# Patient Record
Sex: Male | Born: 1985 | Race: Black or African American | Hispanic: No | Marital: Single | Smoking: Current every day smoker
Health system: Southern US, Community
[De-identification: ages and names within clinical notes are randomized; demographics above are authoritative.]

## PROBLEM LIST (undated history)

## (undated) DIAGNOSIS — F419 Anxiety disorder, unspecified: Secondary | ICD-10-CM

## (undated) DIAGNOSIS — I10 Essential (primary) hypertension: Secondary | ICD-10-CM

## (undated) DIAGNOSIS — B20 Human immunodeficiency virus [HIV] disease: Secondary | ICD-10-CM

---

## 2014-01-22 ENCOUNTER — Ambulatory Visit: Payer: Self-pay | Admitting: Family Medicine

## 2014-01-22 LAB — GC/CHLAMYDIA PROBE AMP

## 2014-02-18 ENCOUNTER — Emergency Department: Payer: Self-pay | Admitting: Emergency Medicine

## 2014-02-18 ENCOUNTER — Ambulatory Visit: Payer: Self-pay | Admitting: Family Medicine

## 2014-02-19 LAB — URINALYSIS, COMPLETE
Bacteria: NONE SEEN
Bilirubin,UR: NEGATIVE
Blood: NEGATIVE
GLUCOSE, UR: NEGATIVE mg/dL (ref 0–75)
Ketone: NEGATIVE
LEUKOCYTE ESTERASE: NEGATIVE
Nitrite: NEGATIVE
PH: 6 (ref 4.5–8.0)
Protein: NEGATIVE
RBC,UR: 2 /HPF (ref 0–5)
SPECIFIC GRAVITY: 1.02 (ref 1.003–1.030)

## 2014-02-19 LAB — CBC
HCT: 46.3 % (ref 40.0–52.0)
HGB: 15.7 g/dL (ref 13.0–18.0)
MCH: 29.2 pg (ref 26.0–34.0)
MCHC: 33.9 g/dL (ref 32.0–36.0)
MCV: 86 fL (ref 80–100)
Platelet: 153 10*3/uL (ref 150–440)
RBC: 5.39 10*6/uL (ref 4.40–5.90)
RDW: 14.2 % (ref 11.5–14.5)
WBC: 8.6 10*3/uL (ref 3.8–10.6)

## 2014-02-19 LAB — BASIC METABOLIC PANEL
Anion Gap: 4 — ABNORMAL LOW (ref 7–16)
BUN: 6 mg/dL — ABNORMAL LOW (ref 7–18)
CALCIUM: 8.7 mg/dL (ref 8.5–10.1)
CREATININE: 0.87 mg/dL (ref 0.60–1.30)
Chloride: 107 mmol/L (ref 98–107)
Co2: 27 mmol/L (ref 21–32)
EGFR (African American): >60
Glucose: 79 mg/dL (ref 65–99)
OSMOLALITY: 272 (ref 275–301)
Potassium: 3.5 mmol/L (ref 3.5–5.1)
Sodium: 138 mmol/L (ref 136–145)

## 2014-02-21 ENCOUNTER — Observation Stay: Payer: Self-pay | Admitting: Surgery

## 2014-02-21 LAB — CBC WITH DIFFERENTIAL/PLATELET
BASOS ABS: 0 10*3/uL (ref 0.0–0.1)
Basophil %: 0.4 %
Eosinophil #: 0 10*3/uL (ref 0.0–0.7)
Eosinophil %: 0.2 %
HCT: 46 % (ref 40.0–52.0)
HGB: 15.3 g/dL (ref 13.0–18.0)
LYMPHS ABS: 2 10*3/uL (ref 1.0–3.6)
Lymphocyte %: 19.2 %
MCH: 28.1 pg (ref 26.0–34.0)
MCHC: 33.3 g/dL (ref 32.0–36.0)
MCV: 84 fL (ref 80–100)
Monocyte #: 1.2 x10 3/mm — ABNORMAL HIGH (ref 0.2–1.0)
Monocyte %: 11.3 %
NEUTROS ABS: 7.1 10*3/uL — AB (ref 1.4–6.5)
NEUTROS PCT: 68.9 %
PLATELETS: 168 10*3/uL (ref 150–440)
RBC: 5.46 10*6/uL (ref 4.40–5.90)
RDW: 13.6 % (ref 11.5–14.5)
WBC: 10.4 10*3/uL (ref 3.8–10.6)

## 2014-02-21 LAB — COMPREHENSIVE METABOLIC PANEL
ALBUMIN: 3.1 g/dL — AB (ref 3.4–5.0)
ANION GAP: 4 — AB (ref 7–16)
Alkaline Phosphatase: 79 U/L
BILIRUBIN TOTAL: 1.2 mg/dL — AB (ref 0.2–1.0)
BUN: 8 mg/dL (ref 7–18)
CREATININE: 0.83 mg/dL (ref 0.60–1.30)
Calcium, Total: 8.4 mg/dL — ABNORMAL LOW (ref 8.5–10.1)
Chloride: 105 mmol/L (ref 98–107)
Co2: 27 mmol/L (ref 21–32)
EGFR (African American): >60
EGFR (Non-African Amer.): >60
GLUCOSE: 100 mg/dL — AB (ref 65–99)
Osmolality: 270 (ref 275–301)
Potassium: 3.5 mmol/L (ref 3.5–5.1)
SGOT(AST): 22 U/L (ref 15–37)
SGPT (ALT): 19 U/L (ref 12–78)
SODIUM: 136 mmol/L (ref 136–145)
TOTAL PROTEIN: 8.6 g/dL — AB (ref 6.4–8.2)

## 2014-04-12 ENCOUNTER — Ambulatory Visit: Payer: Self-pay | Admitting: Family Medicine

## 2014-04-12 LAB — GC/CHLAMYDIA PROBE AMP

## 2014-08-03 ENCOUNTER — Encounter (HOSPITAL_COMMUNITY): Payer: Self-pay | Admitting: Emergency Medicine

## 2014-08-03 ENCOUNTER — Emergency Department (HOSPITAL_COMMUNITY)
Admission: EM | Admit: 2014-08-03 | Discharge: 2014-08-03 | Disposition: A | Discharge status: Home / Self Care | Payer: Self-pay | Attending: Emergency Medicine | Admitting: Emergency Medicine

## 2014-08-03 DIAGNOSIS — IMO0002 Reserved for concepts with insufficient information to code with codable children: Secondary | ICD-10-CM | POA: Insufficient documentation

## 2014-08-03 DIAGNOSIS — S0083XA Contusion of other part of head, initial encounter: Principal | ICD-10-CM | POA: Insufficient documentation

## 2014-08-03 DIAGNOSIS — S199XXA Unspecified injury of neck, initial encounter: Secondary | ICD-10-CM

## 2014-08-03 DIAGNOSIS — T07XXXA Unspecified multiple injuries, initial encounter: Secondary | ICD-10-CM

## 2014-08-03 DIAGNOSIS — S0003XA Contusion of scalp, initial encounter: Secondary | ICD-10-CM | POA: Insufficient documentation

## 2014-08-03 DIAGNOSIS — S0993XA Unspecified injury of face, initial encounter: Secondary | ICD-10-CM | POA: Insufficient documentation

## 2014-08-03 DIAGNOSIS — Z21 Asymptomatic human immunodeficiency virus [HIV] infection status: Secondary | ICD-10-CM | POA: Insufficient documentation

## 2014-08-03 DIAGNOSIS — F17200 Nicotine dependence, unspecified, uncomplicated: Secondary | ICD-10-CM | POA: Insufficient documentation

## 2014-08-03 DIAGNOSIS — S1093XA Contusion of unspecified part of neck, initial encounter: Secondary | ICD-10-CM

## 2014-08-03 HISTORY — DX: Human immunodeficiency virus (HIV) disease: B20

## 2014-08-03 MED ORDER — IBUPROFEN 800 MG PO TABS: 800.0000 mg | ORAL_TABLET | Freq: Three times a day (TID) | ORAL | Status: DC

## 2014-08-03 MED ORDER — IBUPROFEN 800 MG PO TABS
800.0000 mg | ORAL_TABLET | Freq: Once | ORAL | Status: AC
  Administered 2014-08-03: 800 mg via ORAL
  Filled 2014-08-03: qty 1

## 2014-08-03 NOTE — ED Notes (Signed)
Pt waiting to speak with social worker per request. Social worker aware.
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
## 2014-08-03 NOTE — Progress Notes (Signed)
CSW met with pt to discuss transportation options. Pt requested taxi voucher. CSW informed pt he did not meet criteria for voucher. Pt was in town for the evening with his cousin. He does not have a wallet and states he does not know anyone in [place]. States he lives in [place]. Pt states his cousin lives in [place]. Pt attempted to call cousin multiple times with CSW in room but unable to reach. CSW problem solved with pt and came up with plan for bus pass and train ticket to [place]. CSW provided bus pass, ticket, and directions. Pt thanked CSW for these resources.  Rochele Pages,     ED CSW  phone: [phone] 10:44am
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
## 2014-08-03 NOTE — ED Notes (Signed)
Pt to be transported via bus to train station.  SW preparing train ticket at present time.  Reports will be done within the hour.  Pt aware,  Pt given [place] sandwich and ginger ale.

## 2014-08-03 NOTE — ED Notes (Signed)
Pt refuses to sign consent to treat, refuses to speak with registration or any nursing staff. Crying in the room. VSS. Will let pt calm down some and attempt to triage again shortly.
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
## 2014-08-03 NOTE — ED Notes (Signed)
Social worker at bedside.

## 2014-08-03 NOTE — ED Notes (Addendum)
Per PA give pt phone to call for ride home. Pt given ice water.
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
## 2014-08-03 NOTE — Discharge Instructions (Signed)

## 2014-08-04 NOTE — ED Provider Notes (Signed)
Medical screening examination/treatment/procedure(s) were performed by non-physician practitioner and as supervising physician I was immediately available for consultation/collaboration.   EKG Interpretation None        Purvis Sheffield, MD 08/04/14 [phone]

## 2015-04-05 NOTE — H&P (Signed)
Subjective/Chief Complaint left groin pain and swelling.   History of Present Illness 29 y/o male with HIV presented monday to ER with pain and swelling in left groin.  Seen in ER and placed on antibiotics and was told to follow-up in our office.  Symptoms became worse and he returns.  Imaging on Monday showed a large abscess in left groin.   Past History HIV positive since 2000.   Past Med/Surgical Hx:  Sleep Apnea:   Depression:   OSA:   HIV:   ALLERGIES:  vancomycin: Anaphylaxis   Other Allergies none   HOME MEDICATIONS:  Medication Instructions Status  Cleocin HCl 300 mg oral capsule 1 cap(s) orally every 6 hours Active  ciprofloxacin 500 mg oral tablet 1 tab(s) orally every 12 hours Active  acetaminophen-oxyCODONE 300 mg-5 mg oral tablet 1 tab(s) orally every 6 hours as needed for pain Active  fluconazole 150 mg oral tablet 1 tab(s) orally once a week for skin rash Active  IBU 600 mg oral tablet 1 tab(s) orally up to 3 times a day as needed for pain Active  traMADol 50 mg oral tablet 1 tab(s) orally 1 to 3 times a day as needed for pain Active  Norvir  orally  Active  Truvada  orally  Active  Reyataz  orally  Active   Family and Social History:  Family History Non-Contributory   Social History positive  tobacco   + Tobacco Current (within 1 year)   Place of Living Home   Review of Systems:  Subjective/Chief Complaint see above.   Fever/Chills Yes   Medications/Allergies Reviewed Medications/Allergies reviewed   Physical Exam:  GEN no acute distress, disheveled, 100.1, p 100 bp 142/82   HEENT pale conjunctivae, PERRL   NECK supple   RESP normal resp effort   CARD regular rate   ABD no hernia  soft  large tender left groin mass, he was so tender and anxious he did not allow me to fully exam him.   LYMPH negative neck   EXTR negative cyanosis/clubbing   SKIN normal to palpation   NEURO cranial nerves intact   PSYCH A+O to time, place, person    Lab Results:  Hepatic:  12-Mar-15 18:01   Bilirubin, Total  1.2  Alkaline Phosphatase 79 (45-117 NOTE: New Reference Range 11/02/13)  SGPT (ALT) 19  SGOT (AST) 22  Total Protein, Serum  8.6  Albumin, Serum  3.1  Routine Chem:  12-Mar-15 18:01   Glucose, Serum  100  BUN 8  Creatinine (comp) 0.83  Sodium, Serum 136  Potassium, Serum 3.5  Chloride, Serum 105  CO2, Serum 27  Calcium (Total), Serum  8.4  Osmolality (calc) 270  eGFR (African American) >60  eGFR (Non-African American) >60 (eGFR values <37m/min/1.73 m2 may be an indication of chronic kidney disease (CKD). Calculated eGFR is useful in patients with stable renal function. The eGFR calculation will not be reliable in acutely ill patients when serum creatinine is changing rapidly. It is not useful in  patients on dialysis. The eGFR calculation may not be applicable to patients at the low and high extremes of body sizes, pregnant women, and vegetarians.)  Anion Gap  4  Routine Hem:  12-Mar-15 18:01   WBC (CBC) 10.4  RBC (CBC) 5.46  Hemoglobin (CBC) 15.3  Hematocrit (CBC) 46.0  Platelet Count (CBC) 168  MCV 84  MCH 28.1  MCHC 33.3  RDW 13.6  Neutrophil % 68.9  Lymphocyte % 19.2  Monocyte % 11.3  Eosinophil % 0.2  Basophil % 0.4  Neutrophil #  7.1  Lymphocyte # 2.0  Monocyte #  1.2  Eosinophil # 0.0  Basophil # 0.0 (Result(s) reported on 21 Feb 2014 at 07:24PM.)   Radiology Results: [place]:    10-Mar-15 02:26, US Testicle  US Testicle  REASON FOR EXAM:    left groin pain and swelling, pain to testicle and   soft tissue swelling in ingui  COMMENTS:       PROCEDURE: [place]  - US TESTICULAR W/DOPPLER  - Feb 19 2014  2:26AM     CLINICAL DATA:  Left groin pain and swelling, left testicle pain.    EXAM:  SCROTAL ULTRASOUND    DOPPLER ULTRASOUND OF THE TESTICLES    TECHNIQUE:  Complete ultrasound examination of the testicles, epididymis, and  other scrotal structures was performed. Color and spectral  Doppler  ultrasound were also utilizedto evaluate blood flow to the  testicles.    COMPARISON:  None.    FINDINGS:  Right testicle    Measurements: 4.2 x 2.3 x 2.8 cm. No mass or microlithiasis  visualized.    Left testicle    Measurements: 4.7 x 2.5 x 2.9 cm. No mass or microlithiasis  visualized.    Right epididymis:  Normal in size and appearance.    Left epididymis:  Normal in size and appearance.    Hydrocele:  None visualized.    Varicocele:  None visualized.    Pulsed Doppler interrogation of both testes demonstrates low  resistance arterial and venous waveforms bilaterally.    Within the left inguinal region, there is a lobulated fluid  collection with internal debris measuring 4.9 x 2.5 x 3.17. Thick  wall with associated vascularity. No visualized peristalsis.     IMPRESSION:  Normal sonographic appearance to the testicles.    Complex fluid in the region of the left inguinal canal. May reflect  an abscess. Incarcerated loop of bowel not excluded in the  appropriate clinical setting.    Discussed via telephone with Dr. Dahlia Client at 2:35 a.m. on 02/19/2014.      Electronically Signed    By: Carlos Levering M.D.    On: 02/19/2014 02:38     Verified By: Tommi Rumps, M.D.,  [place]:  PACS Image    10-Mar-15 05:08, CT Pelvis With Contrast  PACS Image  CT:  CT Pelvis With Contrast  REASON FOR EXAM:    swelling over left testicle concern for abscess vs   hernia eval;    NOTE: Nursing  COMMENTS:       PROCEDURE: CT  - CT PELVIS STANDARD W  - Feb 19 2014  5:08AM     CLINICAL DATA:  Left groin mass    EXAM:  CT PELVIS WITH CONTRAST    TECHNIQUE:  Multidetector CT imaging of the pelvis was performed using the  standard protocol following the bolus administration of intravenous  contrast.  CONTRAST:  100 cc Isovue 370    COMPARISON:  02/19/2014 ultrasound    FINDINGS:  Visualizedintraperitoneal contents within normal limits.  Normal  appendix. Thin walled bladder. Within the left perineum, just  lateral to the left inguinal canal, there is a 3.1 x 2.4 cm  peripherally enhancing fluid collection. No appreciable  communication with the inguinal canal or peritoneal contents.  Overlying subcutaneous fat stranding and skin thickening may be  reactive or reflect cellulitis. No subcutaneous gas. Prominent left  inguinal lymph nodes are likely reactive. Normal caliber iliac  vasculature. No acute osseous finding.   IMPRESSION:  3.1 x 2.4 cm fluid collection within the left perineum adjacent to  the left inguinal canal is most in keeping with an abscess.      Electronically Signed    By: Carlos Levering M.D.    On: 03/10/201506:02         Verified By: Tommi Rumps, M.D.,    Assessment/Admission Diagnosis 29 y/o male with HIV and large left groin abscess.   Plan admit, IV abx, zosyn and Flagyl.   plan I and D in am due to npo status and anesthesia availability.   Electronic Signatures: Sherri Rad (MD)  (Signed 12-Mar-15 20:17)  Authored: CHIEF COMPLAINT and HISTORY, PAST MEDICAL/SURGIAL HISTORY, ALLERGIES, Other Allergies, HOME MEDICATIONS, FAMILY AND SOCIAL HISTORY, REVIEW OF SYSTEMS, PHYSICAL EXAM, LABS, Radiology, ASSESSMENT AND PLAN   Last Updated: 12-Mar-15 20:17 by Sherri Rad (MD)

## 2015-04-05 NOTE — Op Note (Signed)
PATIENT NAME:  Larry [name], Tag L MR#:  [id number] DATE OF BIRTH:  1986/02/27  DATE OF PROCEDURE:  02/22/2014  PREOPERATIVE DIAGNOSIS: Left upper scrotal abscess.   POSTOPERATIVE DIAGNOSIS: Left upper scrotal abscess.   OPERATION PERFORMED: Incision and drainage of left upper scrotal abscess.   SURGEON: Claude MangesWilliam F. Graesyn Schreifels, M.D.   ANESTHESIA: General.   PROCEDURE IN DETAIL: The patient was placed supine on the operating room table and prepped and draped in the usual sterile fashion. A 1 inch incision was made in the upper left hemiscrotum and lot of creamy pus was drained. The base of the abscess cavity was completely granulated. It was irrigated and packed with saline-soaked 2 inch Kling and fluffs and mesh panties were placed on top of that completing the procedure. The patient tolerated the procedure well. There were no complications. ____________________________ Claude MangesWilliam F. Amariah Kierstead, MD wfm:sb D: 02/22/2014 13:11:16 ET T: 02/22/2014 14:03:18 ET JOB#: [id number]  cc: Claude MangesWilliam F. Nickson Middlesworth, MD, <Dictator> Claude MangesWILLIAM F Darrelyn Morro MD ELECTRONICALLY SIGNED 02/22/2014 15:41

## 2018-04-11 ENCOUNTER — Emergency Department: Payer: Medicare Other

## 2018-04-11 ENCOUNTER — Encounter: Payer: Self-pay | Admitting: Emergency Medicine

## 2018-04-11 ENCOUNTER — Emergency Department
Admission: EM | Admit: 2018-04-11 | Discharge: 2018-04-11 | Disposition: A | Discharge status: Home / Self Care | Payer: Medicare Other | Attending: Emergency Medicine | Admitting: Emergency Medicine

## 2018-04-11 ENCOUNTER — Other Ambulatory Visit: Payer: Self-pay

## 2018-04-11 DIAGNOSIS — Z79899 Other long term (current) drug therapy: Secondary | ICD-10-CM | POA: Insufficient documentation

## 2018-04-11 DIAGNOSIS — J189 Pneumonia, unspecified organism: Secondary | ICD-10-CM

## 2018-04-11 DIAGNOSIS — Z21 Asymptomatic human immunodeficiency virus [HIV] infection status: Secondary | ICD-10-CM | POA: Insufficient documentation

## 2018-04-11 DIAGNOSIS — F329 Major depressive disorder, single episode, unspecified: Secondary | ICD-10-CM | POA: Diagnosis not present

## 2018-04-11 DIAGNOSIS — J181 Lobar pneumonia, unspecified organism: Secondary | ICD-10-CM | POA: Insufficient documentation

## 2018-04-11 DIAGNOSIS — F17210 Nicotine dependence, cigarettes, uncomplicated: Secondary | ICD-10-CM | POA: Diagnosis not present

## 2018-04-11 DIAGNOSIS — R002 Palpitations: Secondary | ICD-10-CM | POA: Insufficient documentation

## 2018-04-11 DIAGNOSIS — R05 Cough: Secondary | ICD-10-CM | POA: Diagnosis present

## 2018-04-11 MED ORDER — SULFAMETHOXAZOLE-TRIMETHOPRIM 800-160 MG PO TABS: 1.0000 | ORAL_TABLET | Freq: Two times a day (BID) | ORAL | 0 refills | Status: DC

## 2018-04-11 MED ORDER — SULFAMETHOXAZOLE-TRIMETHOPRIM 800-160 MG PO TABS
1.0000 | ORAL_TABLET | Freq: Once | ORAL | Status: AC
  Administered 2018-04-11: 1 via ORAL
  Filled 2018-04-11: qty 1

## 2018-04-11 MED ORDER — HYDROCOD POLST-CPM POLST ER 10-8 MG/5ML PO SUER
ORAL | Status: AC
  Filled 2018-04-11: qty 5

## 2018-04-11 MED ORDER — HYDROCOD POLST-CPM POLST ER 10-8 MG/5ML PO SUER: 5.0000 mL | Freq: Two times a day (BID) | ORAL | 0 refills | Status: DC | PRN

## 2018-04-11 MED ORDER — HYDROCOD POLST-CPM POLST ER 10-8 MG/5ML PO SUER
5.0000 mL | Freq: Once | ORAL | Status: AC
  Administered 2018-04-11: 5 mL via ORAL

## 2018-04-11 NOTE — Discharge Instructions (Addendum)
Follow-up with your primary care provider when you get home.  Call and make an appointment to be rechecked next week.  Begin taking Bactrim DS twice daily for 10 days and Tussionex 1 teaspoon every 12 hours as needed for cough and congestion.  This medication could cause drowsiness as it contains a narcotic.  Do not take more than what is directed on the label.  Return to the emergency department if any severe worsening of your symptoms.

## 2018-04-11 NOTE — ED Notes (Signed)
First Nurse Note: Pt reports that he is not feeling well that, having palpitations and is feeling depressed. I was speaking to the nurse with [facility] and he interrupted several times and stood at first nurse desk and would not move back to the sign to wait to be checked in.

## 2018-04-11 NOTE — ED Provider Notes (Signed)
[facility] Emergency Department Provider Note   ____________________________________________   First MD Initiated Contact with Patient 04/11/18 1352     (approximate)  I have reviewed the triage vital signs and the nursing notes.   HISTORY  Chief Complaint Cough   HPI Larry [name] is a 32 y.o. male is here with complaint of productive cough for the last 4 to 5 days.  Patient states that he has been taking over-the-counter allergy medication without any relief.  He states currently he is a clear productive mucus.  He is unaware of any fever.  He smokes 1/2 pack cigarettes a day and occasionally smokes marijuana.  Currently patient is visiting in the area and will be returning to [place] this weekend.  Currently he denies any pain except with coughing.     Past Medical History:  Diagnosis Date  . HIV (human immunodeficiency virus infection) (HCC)     There are no active problems to display for this patient.   History reviewed. No pertinent surgical history.  Prior to Admission medications   Medication Sig Start Date End Date Taking? Authorizing Provider  chlorpheniramine-HYDROcodone (TUSSIONEX PENNKINETIC ER) 10-8 MG/5ML SUER Take 5 mLs by mouth every 12 (twelve) hours as needed for cough. 04/11/18   Tommi Rumps, PA-C  ibuprofen (ADVIL,MOTRIN) 800 MG tablet Take 1 tablet (800 mg total) by mouth 3 (three) times daily. 08/03/14   Elson Areas, PA-C  sulfamethoxazole-trimethoprim (BACTRIM DS,SEPTRA DS) 800-160 MG tablet Take 1 tablet by mouth 2 (two) times daily. 04/11/18   Tommi Rumps, PA-C    Allergies Vancomycin  No family history on file.  Social History Social History   Tobacco Use  . Smoking status: Current Every Day Smoker  Substance Use Topics  . Alcohol use: Yes  . Drug use: Not on file    Review of Systems Constitutional: No fever/chills ENT: No sore throat.  Negative for nasal congestion. Cardiovascular:  Denies chest pain. Respiratory: Denies shortness of breath.  Does have a productive cough. Gastrointestinal:  No nausea, no vomiting.   Musculoskeletal: Negative for back pain. Skin: Negative for rash. Neurological: Negative for headaches, focal weakness or numbness. ____________________________________________   PHYSICAL EXAM:  VITAL SIGNS: ED Triage Vitals  Enc Vitals Group     BP 04/11/18 1341 132/80     Pulse Rate 04/11/18 1341 91     Resp 04/11/18 1341 16     Temp 04/11/18 1341 98.4 F (36.9 C)     Temp Source 04/11/18 1341 Oral     SpO2 04/11/18 1341 96 %     Weight 04/11/18 1342 175 lb (79.4 kg)     Height 04/11/18 1342  (1.702 m)     Head Circumference --      Peak Flow --      Pain Score 04/11/18 1342 0     Pain Loc --      Pain Edu? --      Excl. in GC? --    Constitutional: Alert and oriented. Well appearing and in no acute distress. Eyes: Conjunctivae are normal.  Head: Atraumatic. Nose: No congestion/rhinnorhea. Mouth/Throat: Mucous membranes are moist.  Oropharynx non-erythematous. Neck: No stridor.   Hematological/Lymphatic/Immunilogical: No cervical lymphadenopathy. Cardiovascular: Normal rate, regular rhythm. Grossly normal heart sounds.  Good peripheral circulation. Respiratory: Normal respiratory effort.  No retractions. Lungs no wheezes or rhonchi were noted patient does have a very congested cough.  No respiratory distress and patient is able talking  complete sentences without any difficulty. Gastrointestinal: Soft and nontender. No distention.  Musculoskeletal: Moves upper and lower extremities with any difficulty and normal gait was noted. Neurologic:  Normal speech and language. No gross focal neurologic deficits are appreciated. No gait instability. Skin:  Skin is warm, dry and intact. No rash noted. Psychiatric: Mood and affect are normal. Speech and behavior are normal.  ____________________________________________   LABS (all labs  ordered are listed, but only abnormal results are displayed)  Labs Reviewed - No data to display  RADIOLOGY  ED MD interpretation:   Increased bronchial markings.  Official radiology report(s): Dg Chest 2 View  Result Date: 04/11/2018 CLINICAL DATA:  Abdominal pain. EXAM: CHEST - 2 VIEW COMPARISON:  No prior. FINDINGS: Mediastinum and hilar structures are normal. Mild left base atelectasis/infiltrate. Tiny left pleural effusion. No pneumothorax. Heart size normal. Thoracic spine scoliosis IMPRESSION: Mild left base atelectasis/infiltrate.  Tiny left pleural effusion Electronically Signed   By: Maisie Fus  Register   On: 04/11/2018 14:34    ____________________________________________   PROCEDURES  Procedure(s) performed: None  Procedures  Critical Care performed: No  ____________________________________________   INITIAL IMPRESSION / ASSESSMENT AND PLAN / ED COURSE  Patient is encouraged to follow-up with his PCP when he gets back home.  Patient was made aware that x-ray shows either atelectasis or infiltration.  Given his symptoms we will treat for pneumonia rather than atelectasis.  Patient was given a prescription for Tussionex 1 teaspoon every 12 hours as needed for cough and Bactrim DS twice daily for 10 days.  He was given each medication in the ED prior to his discharge.  Patient is return to the emergency department this week if any worsening of his symptoms.  ____________________________________________   FINAL CLINICAL IMPRESSION(S) / ED DIAGNOSES  Final diagnoses:  Pneumonia of left lower lobe due to infectious organism [facility])     ED Discharge Orders        Ordered    chlorpheniramine-HYDROcodone (TUSSIONEX PENNKINETIC ER) 10-8 MG/5ML SUER  Every 12 hours PRN     04/11/18 1508    sulfamethoxazole-trimethoprim (BACTRIM DS,SEPTRA DS) 800-160 MG tablet  2 times daily     04/11/18 1508       Note:  This document was prepared using Dragon voice recognition  software and may include unintentional dictation errors.    Tommi Rumps, PA-C 04/11/18 1537    Jene Every, MD 04/12/18 1319

## 2018-04-11 NOTE — ED Triage Notes (Signed)
Pt c/o productive cough x 4-5 days. Pt states that he is getting up clear mucus. Pt states that it is hurting when he coughs. Pt is in NAD at this time.

## 2019-08-12 ENCOUNTER — Encounter: Payer: Self-pay | Admitting: Emergency Medicine

## 2019-08-12 ENCOUNTER — Observation Stay
Admission: EM | Admit: 2019-08-12 | Discharge: 2019-08-13 | Disposition: A | Discharge status: Home / Self Care | Payer: Medicare Other | Attending: Specialist | Admitting: Specialist

## 2019-08-12 ENCOUNTER — Other Ambulatory Visit: Payer: Self-pay

## 2019-08-12 ENCOUNTER — Emergency Department: Payer: Medicare Other

## 2019-08-12 DIAGNOSIS — Z21 Asymptomatic human immunodeficiency virus [HIV] infection status: Secondary | ICD-10-CM | POA: Insufficient documentation

## 2019-08-12 DIAGNOSIS — I10 Essential (primary) hypertension: Secondary | ICD-10-CM | POA: Diagnosis not present

## 2019-08-12 DIAGNOSIS — X58XXXA Exposure to other specified factors, initial encounter: Secondary | ICD-10-CM | POA: Diagnosis not present

## 2019-08-12 DIAGNOSIS — Z20828 Contact with and (suspected) exposure to other viral communicable diseases: Secondary | ICD-10-CM | POA: Diagnosis not present

## 2019-08-12 DIAGNOSIS — Z419 Encounter for procedure for purposes other than remedying health state, unspecified: Secondary | ICD-10-CM

## 2019-08-12 DIAGNOSIS — R52 Pain, unspecified: Secondary | ICD-10-CM

## 2019-08-12 DIAGNOSIS — Z881 Allergy status to other antibiotic agents status: Secondary | ICD-10-CM | POA: Insufficient documentation

## 2019-08-12 DIAGNOSIS — M24411 Recurrent dislocation, right shoulder: Secondary | ICD-10-CM | POA: Insufficient documentation

## 2019-08-12 DIAGNOSIS — Y9389 Activity, other specified: Secondary | ICD-10-CM | POA: Insufficient documentation

## 2019-08-12 DIAGNOSIS — S4291XA Fracture of right shoulder girdle, part unspecified, initial encounter for closed fracture: Principal | ICD-10-CM | POA: Insufficient documentation

## 2019-08-12 DIAGNOSIS — S43014A Anterior dislocation of right humerus, initial encounter: Secondary | ICD-10-CM

## 2019-08-12 HISTORY — DX: Essential (primary) hypertension: I10

## 2019-08-12 HISTORY — DX: Anxiety disorder, unspecified: F41.9

## 2019-08-12 IMAGING — DX RIGHT SHOULDER - 2+ VIEW
1 series · 1 of 1 positions shown · non-contrast
Comparison: Pre reduction radiographs earlier this day at [DATE]
p.m.

CLINICAL DATA: Right shoulder dislocation. Post reduction.

EXAM:
RIGHT SHOULDER - 2+ VIEW

[shoulder obl]
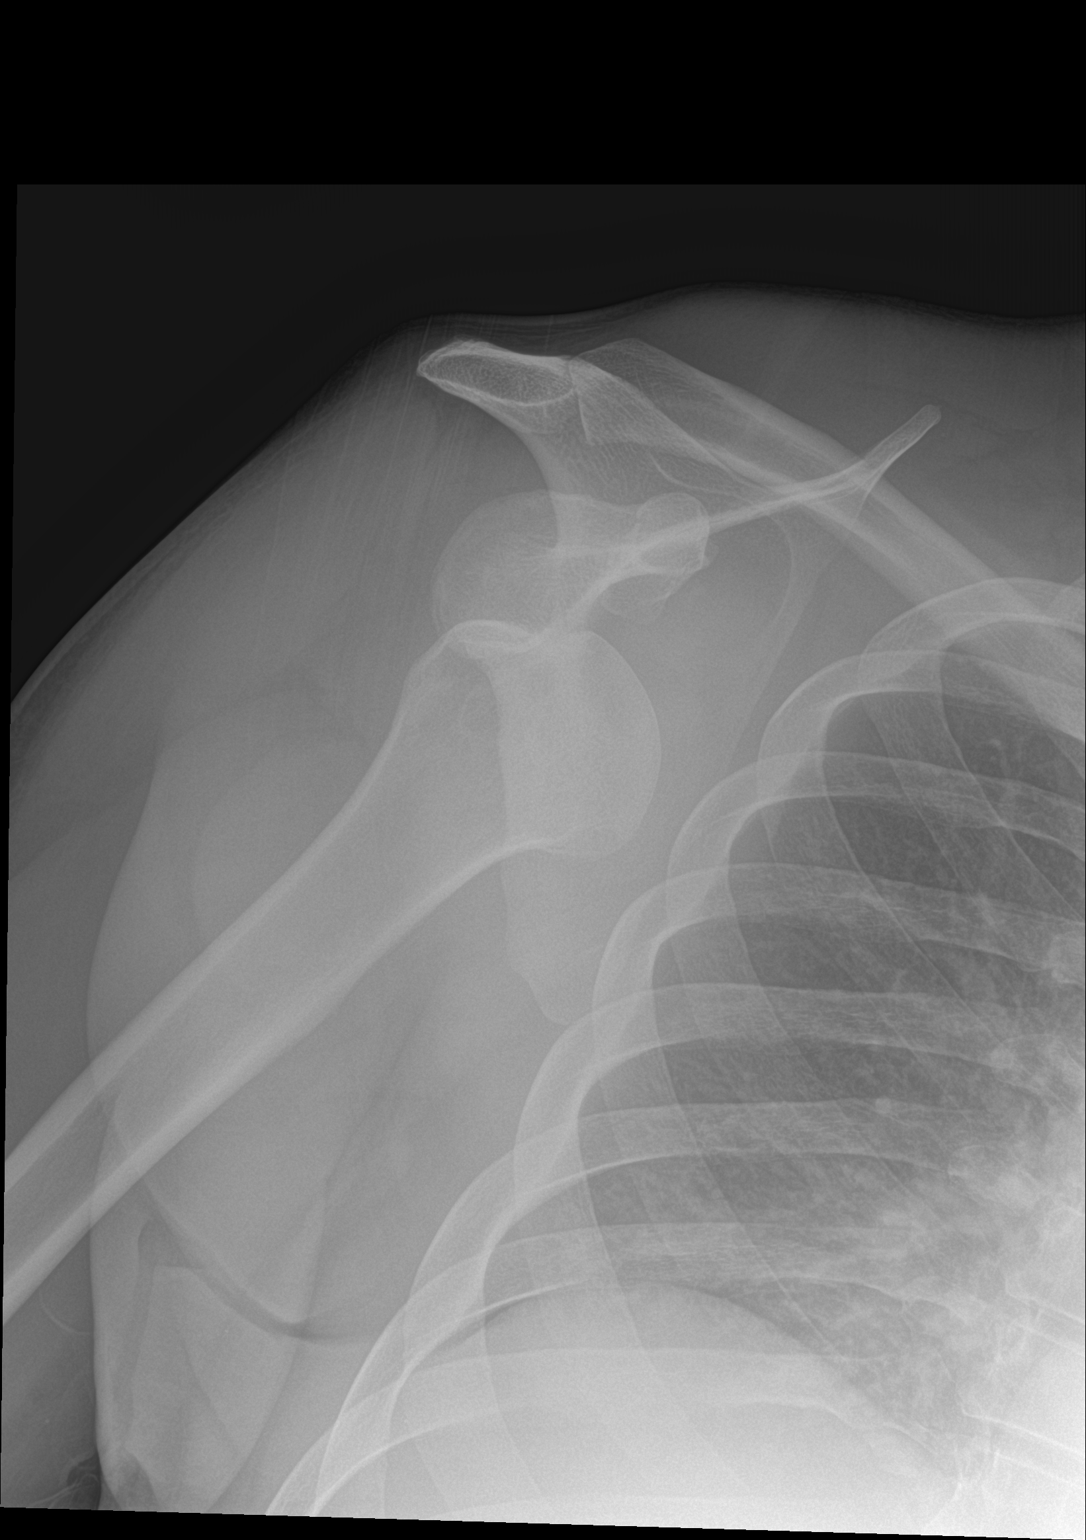

[1 of 1 positions shown; findings below may reference images not displayed]

FINDINGS: Single AP view at [DATE] hr: Persistent anterior shoulder
dislocation. Hill-Sachs impaction fracture to the lateral humeral
head. Acromioclavicular joint is congruent.
IMPRESSION: Persistent anterior shoulder dislocation. Hill-Sachs impaction
fracture to the lateral humeral head.

## 2019-08-12 MED ORDER — KETAMINE HCL 10 MG/ML IJ SOLN
40.0000 mg | Freq: Once | INTRAMUSCULAR | Status: AC
Start: 2019-08-12 — End: 2019-08-12
  Administered 2019-08-12: 23:00:00 40 mg via INTRAVENOUS
  Filled 2019-08-12: qty 1

## 2019-08-12 MED ORDER — HYDROMORPHONE HCL 1 MG/ML IJ SOLN
1.0000 mg | Freq: Once | INTRAMUSCULAR | Status: AC
  Administered 2019-08-12: 1 mg via INTRAVENOUS
  Filled 2019-08-12: qty 1

## 2019-08-12 MED ORDER — PROPOFOL 10 MG/ML IV BOLUS
40.0000 mg | Freq: Once | INTRAVENOUS | Status: AC
  Administered 2019-08-12: 23:00:00 40 mg via INTRAVENOUS
  Filled 2019-08-12: qty 20

## 2019-08-12 MED ORDER — HYDROMORPHONE HCL 1 MG/ML IJ SOLN: 2.0000 mg | INTRAMUSCULAR | Status: DC | PRN

## 2019-08-12 MED ORDER — ONDANSETRON HCL 4 MG/2ML IJ SOLN
4.0000 mg | Freq: Once | INTRAMUSCULAR | Status: AC
  Administered 2019-08-12: 23:00:00 4 mg via INTRAVENOUS
  Filled 2019-08-12: qty 2

## 2019-08-12 MED ORDER — FENTANYL CITRATE (PF) 100 MCG/2ML IJ SOLN
75.0000 ug | Freq: Once | INTRAMUSCULAR | Status: AC
  Administered 2019-08-12: 22:00:00 75 ug via INTRAVENOUS
  Filled 2019-08-12: qty 2

## 2019-08-12 NOTE — Sedation Documentation (Signed)
meds given at this time by MD Jari Pigg

## 2019-08-12 NOTE — Sedation Documentation (Signed)
meds given at this time by MD Jari Pigg and MD [facility] at bedside

## 2019-08-12 NOTE — Sedation Documentation (Signed)
Pt alert and speaking , confused at this time. VSS Will stay at bedside and monitor until pt is A&Ox4
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
## 2019-08-12 NOTE — ED Notes (Signed)
80mg  of ket given by MD

## 2019-08-12 NOTE — ED Notes (Signed)
Pt A&OX4, speech clear. Will continue to monitor

## 2019-08-12 NOTE — Sedation Documentation (Signed)
80mg  of propofol given again at this time by  MD Jari Pigg
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
## 2019-08-12 NOTE — Sedation Documentation (Signed)
MD Burlene Arnt and MD Jari Pigg at bedside

## 2019-08-12 NOTE — ED Notes (Signed)
Pt yelling for pain meds. PT is alert but still confused. VSS. Will continue to monitor at bedside

## 2019-08-12 NOTE — Sedation Documentation (Signed)
40mg h of ketamine given again at this time

## 2019-08-13 ENCOUNTER — Encounter
Admission: EM | Disposition: A | Discharge status: Home / Self Care | Payer: Self-pay | Source: Home / Self Care | Attending: Emergency Medicine

## 2019-08-13 ENCOUNTER — Observation Stay: Payer: Medicare Other

## 2019-08-13 ENCOUNTER — Observation Stay: Payer: Medicare Other | Admitting: Anesthesiology

## 2019-08-13 ENCOUNTER — Encounter: Payer: Self-pay | Admitting: Anesthesiology

## 2019-08-13 ENCOUNTER — Other Ambulatory Visit: Payer: Self-pay

## 2019-08-13 DIAGNOSIS — S4291XA Fracture of right shoulder girdle, part unspecified, initial encounter for closed fracture: Secondary | ICD-10-CM | POA: Diagnosis not present

## 2019-08-13 HISTORY — PX: SHOULDER CLOSED REDUCTION: SHX1051

## 2019-08-13 LAB — CBC
HCT: 47.6 % (ref 39.0–52.0)
Hemoglobin: 15.7 g/dL (ref 13.0–17.0)
MCH: 28.9 pg (ref 26.0–34.0)
MCHC: 33 g/dL (ref 30.0–36.0)
MCV: 87.5 fL (ref 80.0–100.0)
Platelets: 266 10*3/uL (ref 150–400)
RBC: 5.44 MIL/uL (ref 4.22–5.81)
RDW: 13.6 % (ref 11.5–15.5)
WBC: 11.8 10*3/uL — ABNORMAL HIGH (ref 4.0–10.5)
nRBC: 0 % (ref 0.0–0.2)

## 2019-08-13 LAB — TYPE AND SCREEN
ABO/RH(D): A POS
Antibody Screen: NEGATIVE

## 2019-08-13 LAB — PROTIME-INR
INR: 1 (ref 0.8–1.2)
Prothrombin Time: 13.5 seconds (ref 11.4–15.2)

## 2019-08-13 LAB — BASIC METABOLIC PANEL
Anion gap: 4 — ABNORMAL LOW (ref 5–15)
BUN: 12 mg/dL (ref 6–20)
CO2: 27 mmol/L (ref 22–32)
Calcium: 9.2 mg/dL (ref 8.9–10.3)
Chloride: 109 mmol/L (ref 98–111)
Creatinine, Ser: 1.05 mg/dL (ref 0.61–1.24)
GFR calc Af Amer: >60 mL/min (ref >60)
GFR calc non Af Amer: >60 mL/min (ref >60)
Glucose, Bld: 105 mg/dL — ABNORMAL HIGH (ref 70–99)
Potassium: 4.1 mmol/L (ref 3.5–5.1)
Sodium: 140 mmol/L (ref 135–145)

## 2019-08-13 LAB — SARS CORONAVIRUS 2 BY RT PCR (HOSPITAL ORDER, PERFORMED IN ~~LOC~~ HOSPITAL LAB): SARS Coronavirus 2: NEGATIVE

## 2019-08-13 SURGERY — CLOSED REDUCTION, SHOULDER
Anesthesia: General | Site: Shoulder | Laterality: Right

## 2019-08-13 MED ORDER — MORPHINE SULFATE (PF) 4 MG/ML IV SOLN
4.0000 mg | Freq: Once | INTRAVENOUS | Status: AC
  Administered 2019-08-13: 01:00:00 4 mg via INTRAVENOUS
  Filled 2019-08-13: qty 1

## 2019-08-13 MED ORDER — MORPHINE SULFATE (PF) 4 MG/ML IV SOLN: 8.0000 mg | INTRAVENOUS | Status: DC | PRN

## 2019-08-13 MED ORDER — KETAMINE HCL 50 MG/ML IJ SOLN
INTRAMUSCULAR | Status: AC
  Filled 2019-08-13: qty 10

## 2019-08-13 MED ORDER — MIDAZOLAM HCL 2 MG/2ML IJ SOLN
INTRAMUSCULAR | Status: AC
  Filled 2019-08-13: qty 2

## 2019-08-13 MED ORDER — PROMETHAZINE HCL 25 MG/ML IJ SOLN: 6.2500 mg | INTRAMUSCULAR | Status: DC | PRN

## 2019-08-13 MED ORDER — PROPOFOL 10 MG/ML IV BOLUS
INTRAVENOUS | Status: AC
  Filled 2019-08-13: qty 20

## 2019-08-13 MED ORDER — SODIUM CHLORIDE 0.9 % IV SOLN
INTRAVENOUS | Status: DC
  Administered 2019-08-13: 06:00:00 via INTRAVENOUS

## 2019-08-13 MED ORDER — LIDOCAINE HCL (PF) 2 % IJ SOLN
INTRAMUSCULAR | Status: AC
  Filled 2019-08-13: qty 10

## 2019-08-13 MED ORDER — MORPHINE SULFATE (PF) 2 MG/ML IV SOLN
1.0000 mg | INTRAVENOUS | Status: DC | PRN
  Administered 2019-08-13 (×5): 1 mg via INTRAVENOUS
  Filled 2019-08-13 (×6): qty 1

## 2019-08-13 MED ORDER — PROPOFOL 500 MG/50ML IV EMUL
INTRAVENOUS | Status: DC | PRN
  Administered 2019-08-13: 150 ug/kg/min via INTRAVENOUS

## 2019-08-13 MED ORDER — HYDROCODONE-ACETAMINOPHEN 5-325 MG PO TABS: 1.0000 | ORAL_TABLET | Freq: Four times a day (QID) | ORAL | 0 refills | Status: AC | PRN

## 2019-08-13 MED ORDER — PROPOFOL 10 MG/ML IV BOLUS
INTRAVENOUS | Status: DC | PRN
  Administered 2019-08-13: 30 mg via INTRAVENOUS
  Administered 2019-08-13 (×2): 50 mg via INTRAVENOUS

## 2019-08-13 MED ORDER — FENTANYL CITRATE (PF) 100 MCG/2ML IJ SOLN
INTRAMUSCULAR | Status: DC | PRN
  Administered 2019-08-13: 50 ug via INTRAVENOUS

## 2019-08-13 MED ORDER — ONDANSETRON HCL 4 MG/2ML IJ SOLN
INTRAMUSCULAR | Status: AC
  Filled 2019-08-13: qty 2

## 2019-08-13 MED ORDER — MIDAZOLAM HCL 2 MG/2ML IJ SOLN
INTRAMUSCULAR | Status: DC | PRN
  Administered 2019-08-13: 2 mg via INTRAVENOUS

## 2019-08-13 MED ORDER — FENTANYL CITRATE (PF) 100 MCG/2ML IJ SOLN
INTRAMUSCULAR | Status: AC
  Filled 2019-08-13: qty 2

## 2019-08-13 MED ORDER — FENTANYL CITRATE (PF) 100 MCG/2ML IJ SOLN: 25.0000 ug | INTRAMUSCULAR | Status: DC | PRN

## 2019-08-13 MED ORDER — KETAMINE HCL 50 MG/ML IJ SOLN
INTRAMUSCULAR | Status: DC | PRN
  Administered 2019-08-13 (×2): 50 mg via INTRAMUSCULAR

## 2019-08-13 SURGICAL SUPPLY — 2 items
IMMOBILIZER SHDR MD LX WHT (SOFTGOODS) ×3 IMPLANT
KIT TURNOVER KIT A (KITS) ×3 IMPLANT

## 2019-08-13 NOTE — H&P (Signed)
THE PATIENT WAS SEEN PRIOR TO SURGERY TODAY.  HISTORY, ALLERGIES, HOME MEDICATIONS AND OPERATIVE PROCEDURE WERE REVIEWED. RISKS AND BENEFITS OF SURGERY DISCUSSED WITH PATIENT AGAIN.  NO CHANGES FROM INITIAL HISTORY AND PHYSICAL NOTED.    

## 2019-08-13 NOTE — ED Notes (Signed)
Last blood pressure reading very low; in to check on pt; cuff had actually been removed; pt easily awakened; cuff back on arm with reading of 147/87; pt reports right shoulder pain 7/10; understands pain medication not due until 0440; pt had fallen back asleep before I could leave the room

## 2019-08-13 NOTE — Op Note (Signed)
08/13/2019  8:09 AM  PATIENT:  Larry [name]    PRE-OPERATIVE DIAGNOSIS:  dislocated shoulder right recurrent  POST-OPERATIVE DIAGNOSIS:  Same  PROCEDURE:  CLOSED REDUCTION SHOULDER  SURGEON:  Park Breed, MD  ANESTHESIA:   General  PREOPERATIVE INDICATIONS:  Larry [name] is a  33 y.o. male with a diagnosis of dislocated shoulder who failed conservative measures and elected for surgical management.    The risks benefits and alternatives were discussed with the patient preoperatively including but not limited to the risks of infection, bleeding, nerve injury, cardiopulmonary complications, the need for revision surgery, among others, and the patient was willing to proceed.  EBL: None  TOURNIQUET TIME: None  OPERATIVE IMPLANTS: None  OPERATIVE FINDINGS: Anterior inferior right shoulder dislocation recurrent  OPERATIVE PROCEDURE: The patient was brought to the operating room and underwent anesthesia in the supine position on the operating room table.  He was given ketamine, propofol, and fentanyl.  Traction was applied to the right arm and the shoulder was reduced fairly easily.  Range of motion was good.  He was felt stable.  X-rays showed good position of the shoulder.  Patient was placed in a shoulder immobilizer and awakened and taken to recovery in good condition.  Park Breed, MD

## 2019-08-13 NOTE — ED Notes (Signed)
Pt belongings returned by EMS at this time

## 2019-08-13 NOTE — Anesthesia Postprocedure Evaluation (Signed)
Anesthesia Post Note  Patient: Larry [name]  Procedure(s) Performed: CLOSED REDUCTION SHOULDER (Right Shoulder)  Patient location during evaluation: PACU Anesthesia Type: General Level of consciousness: awake and alert Pain management: pain level controlled Vital Signs Assessment: post-procedure vital signs reviewed and stable Respiratory status: spontaneous breathing, nonlabored ventilation, respiratory function stable and patient connected to nasal cannula oxygen Cardiovascular status: blood pressure returned to baseline and stable Postop Assessment: no apparent nausea or vomiting Anesthetic complications: no     Last Vitals:  Vitals:   08/13/19 0855 08/13/19 0930  BP: 122/75 128/81  Pulse: 65 72  Resp: 14 16  Temp: 36.8 C 36.7 C  SpO2: 95% 98%    Last Pain:  Vitals:   08/13/19 1241  TempSrc:   PainSc: 8                  [name]

## 2019-08-13 NOTE — Anesthesia Post-op Follow-up Note (Signed)
Anesthesia QCDR form completed.        
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
## 2019-08-13 NOTE — ED Notes (Signed)
PT signed hard copy of consent for sedation, placed with pt chart

## 2019-08-13 NOTE — Progress Notes (Signed)
Discharge instructions reviewed with patient. He verbalized understanding. IV removed. He is upset about not being prescribed percocet and is requesting to speak with Dr. Sabra Heck before he leaves. Stable condition.

## 2019-08-13 NOTE — Transfer of Care (Signed)
Immediate Anesthesia Transfer of Care Note  Patient: Larry [name]  Procedure(s) Performed: CLOSED REDUCTION SHOULDER (Right Shoulder)  Patient Location: PACU  Anesthesia Type:General  Level of Consciousness: drowsy and patient cooperative  Airway & Oxygen Therapy: Patient Spontanous Breathing and Patient connected to nasal cannula oxygen  Post-op Assessment: Report given to RN and Post -op Vital signs reviewed and stable  Post vital signs: Reviewed and stable  Last Vitals:  Vitals Value Taken Time  BP 125/84 08/13/19 0816  Temp 36.7 C 08/13/19 0816  Pulse 86 08/13/19 0817  Resp 14 08/13/19 0817  SpO2 94 % 08/13/19 0817  Vitals shown include unvalidated device data.  Last Pain:  Vitals:   08/13/19 0638  TempSrc:   PainSc: 9          Complications: No apparent anesthesia complications

## 2019-08-13 NOTE — Discharge Summary (Signed)
Physician Discharge Summary  Patient ID: Larry [name] MRN: [id number] DOB/AGE: Jul 21, 1986 33 y.o.  Admit date: 08/12/2019 Discharge date: 08/13/2019  Admission Diagnoses:  Discharge Diagnoses:  Active Problems:   Traumatic closed displaced fracture of right shoulder with anterior dislocation   Discharged Condition: good  Hospital Course: Successful closed reduction of right shoulder.  Doing well post op   Consults: None  Significant Diagnostic Studies: radiology: X-Ray: right shoulder reduces  Treatments: surgery: closed reduction right shoulder  Discharge Exam: Blood pressure 128/81, pulse 72, temperature 98.1 F (36.7 C), temperature source Oral, resp. rate 16, height 5\' 8"  (1.727 m), weight 81.6 kg, SpO2 98 %. csm good right hand.  No swelling  Disposition: Discharge disposition: 01-Home or Self Care       Discharge Instructions    Call MD for:  persistant dizziness or light-headedness   Complete by: As directed    Call MD for:  persistant nausea and vomiting   Complete by: As directed    Call MD for:  redness, tenderness, or signs of infection (pain, swelling, redness, odor or green/yellow discharge around incision site)   Complete by: As directed    Call MD for:  severe uncontrolled pain   Complete by: As directed    Call MD for:  temperature >100.4   Complete by: As directed    Diet - low sodium heart healthy   Complete by: As directed    Discharge instructions   Complete by: As directed    Ice the shoulder aggressively   Use recliner or upright position against pillows Remain in shoulder immobilizer Squeeze hand and arm muscles to minimize swelling Return to clinic as appointed   Increase activity slowly   Complete by: As directed      Allergies as of 08/13/2019      Reactions   Vancomycin       Medication List    TAKE these medications   HYDROcodone-acetaminophen 5-325 MG tablet Commonly known as: Norco Take 1 tablet by mouth every  6 (six) hours as needed. Notes to patient: Not given in hospital        Signed: Park Breed 08/13/2019, 12:50 PM
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
## 2019-08-13 NOTE — Anesthesia Preprocedure Evaluation (Signed)
Anesthesia Evaluation  Patient identified by MRN, date of birth, ID band Patient awake    Reviewed: Allergy & Precautions, H&P , NPO status , Patient's Chart, lab work & pertinent test results, reviewed documented beta blocker date and time   History of Anesthesia Complications Negative for: history of anesthetic complications  Airway Mallampati: II  TM Distance: >3 FB Neck ROM: full    Dental  (+) Dental Advidsory Given, Missing, Teeth Intact   Pulmonary neg shortness of breath, neg COPD, neg recent URI, Current Smoker,    Pulmonary exam normal        Cardiovascular Exercise Tolerance: Good hypertension, (-) angina(-) Past MI and (-) Cardiac Stents Normal cardiovascular exam(-) dysrhythmias (-) Valvular Problems/Murmurs     Neuro/Psych PSYCHIATRIC DISORDERS Anxiety negative neurological ROS     GI/Hepatic negative GI ROS, Neg liver ROS,   Endo/Other  negative endocrine ROS  Renal/GU negative Renal ROS  negative genitourinary   Musculoskeletal   Abdominal   Peds  Hematology  (+) HIV,   Anesthesia Other Findings Past Medical History: No date: Anxiety No date: HIV (human immunodeficiency virus infection) ([place]) No date: Hypertension   Reproductive/Obstetrics negative OB ROS                             Anesthesia Physical Anesthesia Plan  ASA: III  Anesthesia Plan: General   Post-op Pain Management:    Induction: Intravenous  PONV Risk Score and Plan: 1 and Propofol infusion, TIVA and Treatment may vary due to age or medical condition  Airway Management Planned: Mask  Additional Equipment:   Intra-op Plan:   Post-operative Plan:   Informed Consent: I have reviewed the patients History and Physical, chart, labs and discussed the procedure including the risks, benefits and alternatives for the proposed anesthesia with the patient or authorized representative who has indicated  his/her understanding and acceptance.     Dental Advisory Given  Plan Discussed with: Anesthesiologist, CRNA and Surgeon  Anesthesia Plan Comments:         Anesthesia Quick Evaluation

## 2019-08-13 NOTE — ED Notes (Signed)
Eyes closed, resp even and unlabored
